# Patient Record
Sex: Female | Born: 1970 | Race: White | Hispanic: No | Marital: Married
Health system: Midwestern US, Community
[De-identification: ages and names within clinical notes are randomized; demographics above are authoritative.]

## PROBLEM LIST (undated history)

## (undated) DIAGNOSIS — N200 Calculus of kidney: Secondary | ICD-10-CM

## (undated) DIAGNOSIS — Z1231 Encounter for screening mammogram for malignant neoplasm of breast: Secondary | ICD-10-CM

## (undated) DIAGNOSIS — N63 Unspecified lump in unspecified breast: Secondary | ICD-10-CM

## (undated) DIAGNOSIS — D473 Essential (hemorrhagic) thrombocythemia: Secondary | ICD-10-CM

## (undated) DIAGNOSIS — Z139 Encounter for screening, unspecified: Secondary | ICD-10-CM

## (undated) DIAGNOSIS — I10 Essential (primary) hypertension: Secondary | ICD-10-CM

## (undated) DIAGNOSIS — E669 Obesity, unspecified: Secondary | ICD-10-CM

## (undated) DIAGNOSIS — M1990 Unspecified osteoarthritis, unspecified site: Secondary | ICD-10-CM

## (undated) DIAGNOSIS — J45909 Unspecified asthma, uncomplicated: Secondary | ICD-10-CM

## (undated) DIAGNOSIS — K648 Other hemorrhoids: Secondary | ICD-10-CM

## (undated) HISTORY — PX: CHOLECYSTECTOMY: SHX55

---

## 2011-06-04 ENCOUNTER — Other Ambulatory Visit: Payer: Self-pay | Admitting: Internal Medicine

## 2011-06-04 DIAGNOSIS — M545 Low back pain, unspecified: Secondary | ICD-10-CM

## 2011-06-08 ENCOUNTER — Other Ambulatory Visit: Payer: Self-pay

## 2011-06-22 ENCOUNTER — Other Ambulatory Visit: Payer: Self-pay

## 2011-06-26 ENCOUNTER — Other Ambulatory Visit: Payer: Self-pay

## 2011-07-01 ENCOUNTER — Ambulatory Visit
Admission: RE | Admit: 2011-07-01 | Discharge: 2011-07-01 | Disposition: A | Discharge status: Home / Self Care | Payer: Medicaid Other | Source: Ambulatory Visit | Attending: Internal Medicine | Admitting: Internal Medicine

## 2011-07-01 DIAGNOSIS — M545 Low back pain, unspecified: Secondary | ICD-10-CM

## 2012-01-07 NOTE — H&P (Signed)
Name:       Melissa Villegas, Melissa Villegas                  Admitted:    01/08/2012    Account #:  [id number]                     DOB:         05-Jun-1970  Physician:  Delfina Redwood, MD          Age:         41                               HISTORY AND PHYSICAL      CHIEF COMPLAINT:  Mixed hemorrhoids which are symptomatic.    HISTORY OF PRESENT ILLNESS: The patient is a 41 year old woman referred by  Willow Ora with a significant history of hemorrhoids. She has had them  for over 15 years. She has no other GI symptomatology. She has frequent  itching and prolapse of tissue at the anal outlet.    PAST MEDICAL HISTORY: Significant for femoral rod in 1992, removal of the  rod from the fracture a year later. Medical history is otherwise negative.      MEDICATIONS:  Paxil 30 mg a day.    ALLERGIES  1.  IBUPROFEN.  2.  NAPROXEN.  3.  NONSTEROIDAL ANTI-INFLAMMATORIES IN GENERAL.    SOCIAL HISTORY: She is married, rarely drinks, does not smoke.    FAMILY HISTORY: Significant for depression and subsequent suicide.    REVIEW OF SYSTEMS  Significant for occasional headaches, heartburn, anal outlet discomfort  from her hemorrhoids, occasional joint pains and the remainder of her 10  system review is negative.    PHYSICAL EXAMINATION  GENERAL: Reveals a pleasant 41 year old woman in no acute distress.  HEENT: ENT is unremarkable. Sclerae clear. Pupils reactive.  NECK: Supple.  LUNGS: Clear.  CARDIAC: Regular, without murmur or failure.  ABDOMEN: Soft and benign. No mass or organomegaly.  BREASTS: Deferred. Groins negative.  RECTAL: Reveals mixed hemorrhoids in all quadrants.  EXTREMITIES: No gross deformity.  NEUROLOGIC: Intact.  SKIN: Clear.    ASSESSMENT: A 41 year old woman with symptomatic mixed hemorrhoids.    PLAN: She requires surgical hemorrhoidectomy to get rid of these.  She is aware of the risks of surgery including anesthesia, infection, and  bleeding, and is agreeable to proceed.                      Delfina Redwood, MD    cc:                       Delfina Redwood, MD      WRT/wmx; D: 01/07/2012 10:27 P; T: 01/07/2012 10:43 P; DOC# [id number]; Job#  [id number]

## 2012-01-08 ENCOUNTER — Inpatient Hospital Stay: Payer: BLUE CROSS/BLUE SHIELD

## 2012-01-08 LAB — HCG URINE, QL. - POC: Pregnancy test,urine (POC): NEGATIVE

## 2012-01-08 MED ADMIN — oxyCODONE-acetaminophen (PERCOCET) 5-325 mg per tablet: ORAL | @ 16:00:00 | NDC 00406051223

## 2012-01-08 MED ADMIN — lactated ringers infusion: INTRAVENOUS | @ 13:00:00 | NDC 00409795309

## 2012-01-08 MED ADMIN — bupivacaine 0.25% -EPINEPHrine 1:200,000 (SENSORCAINE) 0.25 %-1:200,000 injection 50 mg: SUBCUTANEOUS | @ 16:00:00 | NDC 00409904201

## 2012-01-08 MED FILL — OXYCODONE-ACETAMINOPHEN 5 MG-325 MG TAB: 5-325 mg | ORAL | Qty: 1

## 2012-01-08 MED FILL — FENTANYL CITRATE (PF) 50 MCG/ML IJ SOLN: 50 mcg/mL | INTRAMUSCULAR | Qty: 5

## 2012-01-08 MED FILL — LACTATED RINGERS IV: INTRAVENOUS | Qty: 1000

## 2012-01-08 MED FILL — ONDANSETRON (PF) 4 MG/2 ML INJECTION: 4 mg/2 mL | INTRAMUSCULAR | Qty: 2

## 2012-01-08 MED FILL — HYDROMORPHONE (PF) 1 MG/ML IJ SOLN: 1 mg/mL | INTRAMUSCULAR | Qty: 1

## 2012-01-08 MED FILL — MIDAZOLAM 1 MG/ML IJ SOLN: 1 mg/mL | INTRAMUSCULAR | Qty: 2

## 2012-01-08 MED FILL — BUPIVACAINE-EPINEPHRINE (PF) 0.25 %-1:200,000 IJ SOLN: 0.25 %-1:200,000 | INTRAMUSCULAR | Qty: 20

## 2012-01-08 NOTE — Op Note (Signed)
Name:      ABREE, CHIEFFO                                          Surgeon:        Delfina Redwood,   MD  Account #: [id number]                 Surgery Date:   01/08/2012  DOB:       Aug 25, 1970  Age:       41                           Location:                                 OPERATIVE REPORT      PREOPERATIVE DIAGNOSIS: Mixed hemorrhoids in 4 quadrants.    POSTOPERATIVE DIAGNOSIS: Mixed hemorrhoids in 4 quadrants.    PROCEDURES PERFORMED:  Complex hemorrhoidectomy with 4 columns excised.    SURGEON: Tawni Pummel. Julious Payer, MD.    ANESTHESIA: General.    PREOPERATIVE MEDICATIONS: None.    ESTIMATED BLOOD LOSS: 10 mL.    SPECIMENS REMOVED: Mixed hemorrhoidal tissues.    INDICATIONS: The patient is a 41 year old white female who presents with  symptomatic bleeding mixed hemorrhoids. Unfortunately, they are so large  that anything short of hemorrhoidectomy will not alleviate the problems of  prolapse and bleeding. She is therefore in today for surgical  hemorrhoidectomy. She is aware of the risks of anesthesia, infection, and  bleeding and is willing to proceed.    DESCRIPTION OF PROCEDURE: After uneventful induction of general anesthetic,  the patient was placed in prone jackknife position and sterilely prepped  and draped. The right anterior, right posterior, left posterior and left  lateral sets of hemorrhoidal columns were all handled in the same fashion  with the same technique for each column in a sequential fashion. Each  column was infiltrated with 0.25% Marcaine with 1:200,000 epinephrine and  prolapsed out of the canal with hemostats. An apex stitch of 3-0 chromic  was placed; and the column was then sharply excised off the internal  sphincter, leaving the internal sphincter musculature intact as well as the  external sphincter musculature. Bleeding was cauterized with Bovie as it  was encountered. The mucosa was then reapproximated with the previously  placed apex stitch of 3-0 chromic in running locked  fashion down to the  anal verge, where it was converted to a simple running suture. Following  this procedure for all 4 columns, all of her hemorrhoidal tissue was  excised and the medium Ferguson retractor still placed very comfortably in  the anal canal without any stenosis. The perianal area was infiltrated with  0.25% Marcaine with 1:200,000 epinephrine for postoperative pain control.  Sterile dressings were applied and the procedure terminated. She tolerated  the operation well. Blood loss was less than 10 mL. There was no  replacement. She remained stable and left the operating room in  satisfactory condition to return to the recovery and observation area.        Reviewed on 01/08/2012 4:01 PM          Delfina Redwood, MD    cc:   Delfina Redwood, MD  WRT/wmx; D: 01/08/2012 11:49 A; T: 01/08/2012 03:25 P; Doc# [id number]; Job#  [id number]

## 2012-01-08 NOTE — Op Note (Signed)
Name:      Melissa Villegas, Melissa Villegas                                          Surgeon:        Delfina Redwood,   MD  Account #: [id number]                 Surgery Date:   01/08/2012  DOB:       02-22-1971  Age:       41                           Location:                                 OPERATIVE REPORT      PREOPERATIVE DIAGNOSIS: Mixed hemorrhoids in 4 quadrants.    POSTOPERATIVE DIAGNOSIS: Mixed hemorrhoids in 4 quadrants.    PROCEDURES PERFORMED:  Complex hemorrhoidectomy with 4 columns excised.    SURGEON: Tawni Pummel. Julious Payer, MD.    ANESTHESIA: General.    PREOPERATIVE MEDICATIONS: None.    ESTIMATED BLOOD LOSS: 10 mL.    SPECIMENS REMOVED: Mixed hemorrhoidal tissues.    INDICATIONS: The patient is a 41 year old white female who presents with  symptomatic bleeding mixed hemorrhoids. Unfortunately, they are so large  that anything short of hemorrhoidectomy will not alleviate the problems of  prolapse and bleeding. She is therefore in today for surgical  hemorrhoidectomy. She is aware of the risks of anesthesia, infection, and  bleeding and is willing to proceed.    DESCRIPTION OF PROCEDURE: After uneventful induction of general anesthetic,  the patient was placed in prone jackknife position and sterilely prepped  and draped. The right anterior, right posterior, left posterior and left  lateral sets of hemorrhoidal columns were all handled in the same fashion  with the same technique for each column in a sequential fashion. Each  column was infiltrated with 0.25% Marcaine with 1:200,000 epinephrine and  prolapsed out of the canal with hemostats. An apex stitch of 3-0 chromic  was placed; and the column was then sharply excised off the internal  sphincter, leaving the internal sphincter musculature intact as well as the  external sphincter musculature. Bleeding was cauterized with Bovie as it  was encountered. The mucosa was then reapproximated with the previously  placed apex stitch of 3-0 chromic in running locked  fashion down to the  anal verge, where it was converted to a simple running suture. Following  this procedure for all 4 columns, all of her hemorrhoidal tissue was  excised and the medium Ferguson retractor still placed very comfortably in  the anal canal without any stenosis. The perianal area was infiltrated with  0.25% Marcaine with 1:200,000 epinephrine for postoperative pain control.  Sterile dressings were applied and the procedure terminated. She tolerated  the operation well. Blood loss was less than 10 mL. There was no  replacement. She remained stable and left the operating room in  satisfactory condition to return to the recovery and observation area.        Reviewed on 01/08/2012 3:44 PM          Delfina Redwood, MD    cc:   Delfina Redwood, MD  WRT/wmx; D: 01/08/2012 11:49 A; T: 01/08/2012 03:25 P; Doc# [id number]; Job#  [id number]

## 2012-01-08 NOTE — Op Note (Signed)
Full note dictated

## 2012-01-08 NOTE — Other (Signed)
Handoff Report from Operating Room to PACU    Report received from roupe rn and vega crna regarding Melissa Villegas.      Surgeon(s):  Delfina Redwood, MD  And Procedure(s) (LRB):  HEMORRHOIDECTOMY EXCISION (N/A)  confirmed   with allergies and dressings discussed.    Anesthesia type, drugs, patient history, complications, estimated blood loss, vital signs, intake and output, and last pain medication were reviewed.

## 2012-01-08 NOTE — Brief Op Note (Signed)
BRIEF OPERATIVE NOTE    Date of Procedure: 01/08/2012   Preoperative Diagnosis: MIXED HEMORRHOIDS  Postoperative Diagnosis: MIXED HEMORRHOIDS    Procedure: Procedure(s):  COMPLEX HEMORRHOIDECTOMY  Surgeon(s) and Role:     * Delfina Redwood, MD - Primary  Anesthesia: General   Estimated Blood Loss: 10cc's  Specimens:   ID Type Source Tests Collected by Time Destination   1 : Hemorrhoids Preservative Rectal  Delfina Redwood, MD 01/08/2012 1110 Pathology      Findings: mixed hemorrhoids in 4 quadrants   Complications: none  Implants: * No implants in log *

## 2012-01-08 NOTE — Progress Notes (Signed)
+  Post-Anesthesia Evaluation and Assessment    Patient: Melissa Villegas MRN: [id number]  SSN: [id number]   Date of Birth: [birth year]  Age: 41 y.o.  Sex: female      Cardiovascular Function/Vital Signs  BP 126/81   Pulse 102   Temp 98.4 ??F (36.9 ??C)   Resp 14   Ht 5\' 5"  (1.651 m)   Wt 77.111 kg (170 lb)   BMI 28.29 kg/m2   SpO2 100%    Patient is status post Procedure(s) with comments:  HEMORRHOIDECTOMY EXCISION - COMPLEX HEMORRHOIDECTOMY.    Nausea/Vomiting: Controlled.    Postoperative hydration reviewed and adequate.    Pain:  Pain Scale 1: Numeric (0 - 10) (01/08/12 1217)  Pain Intensity 1: 2 (01/08/12 1217)   Managed.    Neurological Status:   Neuro (WDL): Exceptions to WDL (01/08/12 1152)   At baseline.    Mental Status and Level of Consciousness: Arousable.    Pulmonary Status:       Adequate oxygenation and airway patent.    Complications related to anesthesia: None    Post-anesthesia assessment completed. No concerns.    Signed By: Docia Chuck, MD    January 08, 2012

## 2012-01-08 NOTE — Op Note (Signed)
Name:      Melissa Villegas, Melissa Villegas                                          Surgeon:        Oaklan Persons R Alanna Storti,   MD  Account #: [id number]                 Surgery Date:   01/08/2012  DOB:       10/15/1970  Age:       41                           Location:                                 OPERATIVE REPORT      PREOPERATIVE DIAGNOSIS: Mixed hemorrhoids in 4 quadrants.    POSTOPERATIVE DIAGNOSIS: Mixed hemorrhoids in 4 quadrants.    PROCEDURES PERFORMED:  Complex hemorrhoidectomy with 4 columns excised.    SURGEON: Ronit Cranfield R. Lexani Corona, MD.    ANESTHESIA: General.    PREOPERATIVE MEDICATIONS: None.    ESTIMATED BLOOD LOSS: 10 mL.    SPECIMENS REMOVED: Mixed hemorrhoidal tissues.    INDICATIONS: The patient is a 41-year-old white female who presents with  symptomatic bleeding mixed hemorrhoids. Unfortunately, they are so large  that anything short of hemorrhoidectomy will not alleviate the problems of  prolapse and bleeding. She is therefore in today for surgical  hemorrhoidectomy. She is aware of the risks of anesthesia, infection, and  bleeding and is willing to proceed.    DESCRIPTION OF PROCEDURE: After uneventful induction of general anesthetic,  the patient was placed in prone jackknife position and sterilely prepped  and draped. The right anterior, right posterior, left posterior and left  lateral sets of hemorrhoidal columns were all handled in the same fashion  with the same technique for each column in a sequential fashion. Each  column was infiltrated with 0.25% Marcaine with 1:200,000 epinephrine and  prolapsed out of the canal with hemostats. An apex stitch of 3-0 chromic  was placed; and the column was then sharply excised off the internal  sphincter, leaving the internal sphincter musculature intact as well as the  external sphincter musculature. Bleeding was cauterized with Bovie as it  was encountered. The mucosa was then reapproximated with the previously  placed apex stitch of 3-0 chromic in running locked  fashion down to the  anal verge, where it was converted to a simple running suture. Following  this procedure for all 4 columns, all of her hemorrhoidal tissue was  excised and the medium Ferguson retractor still placed very comfortably in  the anal canal without any stenosis. The perianal area was infiltrated with  0.25% Marcaine with 1:200,000 epinephrine for postoperative pain control.  Sterile dressings were applied and the procedure terminated. She tolerated  the operation well. Blood loss was less than 10 mL. There was no  replacement. She remained stable and left the operating room in  satisfactory condition to return to the recovery and observation area.        Reviewed on 01/08/2012 3:44 PM          Mahi Zabriskie R Kera Deacon, MD    cc:   Shakyra Mattera R Caine Barfield, MD          WRT/wmx; D: 01/08/2012 11:49 A; T: 01/08/2012 03:25 P; Doc# [id number]; Job#  [id number]

## 2012-01-08 NOTE — Op Note (Signed)
Name:      Saiki, Kit M                                          Surgeon:        May Ozment R Abhishek Levesque,   MD  Account #: [id number]                 Surgery Date:   01/08/2012  DOB:       09/28/1970  Age:       41                           Location:                                 OPERATIVE REPORT      PREOPERATIVE DIAGNOSIS: Mixed hemorrhoids in 4 quadrants.    POSTOPERATIVE DIAGNOSIS: Mixed hemorrhoids in 4 quadrants.    PROCEDURES PERFORMED:  Complex hemorrhoidectomy with 4 columns excised.    SURGEON: Poseidon Pam R. Monque Haggar, MD.    ANESTHESIA: General.    PREOPERATIVE MEDICATIONS: None.    ESTIMATED BLOOD LOSS: 10 mL.    SPECIMENS REMOVED: Mixed hemorrhoidal tissues.    INDICATIONS: The patient is a 41-year-old white female who presents with  symptomatic bleeding mixed hemorrhoids. Unfortunately, they are so large  that anything short of hemorrhoidectomy will not alleviate the problems of  prolapse and bleeding. She is therefore in today for surgical  hemorrhoidectomy. She is aware of the risks of anesthesia, infection, and  bleeding and is willing to proceed.    DESCRIPTION OF PROCEDURE: After uneventful induction of general anesthetic,  the patient was placed in prone jackknife position and sterilely prepped  and draped. The right anterior, right posterior, left posterior and left  lateral sets of hemorrhoidal columns were all handled in the same fashion  with the same technique for each column in a sequential fashion. Each  column was infiltrated with 0.25% Marcaine with 1:200,000 epinephrine and  prolapsed out of the canal with hemostats. An apex stitch of 3-0 chromic  was placed; and the column was then sharply excised off the internal  sphincter, leaving the internal sphincter musculature intact as well as the  external sphincter musculature. Bleeding was cauterized with Bovie as it  was encountered. The mucosa was then reapproximated with the previously  placed apex stitch of 3-0 chromic in running locked  fashion down to the  anal verge, where it was converted to a simple running suture. Following  this procedure for all 4 columns, all of her hemorrhoidal tissue was  excised and the medium Ferguson retractor still placed very comfortably in  the anal canal without any stenosis. The perianal area was infiltrated with  0.25% Marcaine with 1:200,000 epinephrine for postoperative pain control.  Sterile dressings were applied and the procedure terminated. She tolerated  the operation well. Blood loss was less than 10 mL. There was no  replacement. She remained stable and left the operating room in  satisfactory condition to return to the recovery and observation area.        Reviewed on 01/08/2012 4:01 PM          Shemia Bevel R Diyana Starrett, MD    cc:   Esperansa Sarabia R Lilyauna Miedema, MD          WRT/wmx; D: 01/08/2012 11:49 A; T: 01/08/2012 03:25 P; Doc# [id number]; Job#  [id number]

## 2012-01-08 NOTE — Other (Signed)
Pt and husband informed that one percocet was given at 1225.

## 2012-01-08 NOTE — Other (Signed)
Dr. Julious Payer at bedside to speak with patient and husband.

## 2012-01-08 NOTE — Other (Signed)
Dr. Selena Batten informed of migraine pain, at [facility] to assess.

## 2012-01-09 MED FILL — DEXAMETHASONE SODIUM PHOSPHATE 10 MG/ML IJ SOLN: 10 mg/mL | INTRAMUSCULAR | Qty: 1

## 2012-01-09 MED FILL — LACTATED RINGERS IV: INTRAVENOUS | Qty: 1000

## 2012-01-09 MED FILL — LIDOCAINE (PF) 20 MG/ML (2 %) IJ SOLN: 20 mg/mL (2 %) | INTRAMUSCULAR | Qty: 5

## 2012-01-09 MED FILL — ONDANSETRON (PF) 4 MG/2 ML INJECTION: 4 mg/2 mL | INTRAMUSCULAR | Qty: 2

## 2012-01-09 MED FILL — DIPRIVAN 10 MG/ML INTRAVENOUS EMULSION: 10 mg/mL | INTRAVENOUS | Qty: 20

## 2012-01-09 MED FILL — ROCURONIUM 10 MG/ML IV: 10 mg/mL | INTRAVENOUS | Qty: 5

## 2012-01-09 MED FILL — QUELICIN 20 MG/ML INJECTION SOLUTION: 20 mg/mL | INTRAMUSCULAR | Qty: 10

## 2012-04-27 ENCOUNTER — Encounter

## 2013-07-25 ENCOUNTER — Emergency Department (HOSPITAL_COMMUNITY)
Admission: EM | Admit: 2013-07-25 | Discharge: 2013-07-25 | Disposition: A | Discharge status: Home / Self Care | Payer: Medicaid Other | Attending: Emergency Medicine | Admitting: Emergency Medicine

## 2013-07-25 ENCOUNTER — Emergency Department (HOSPITAL_COMMUNITY): Payer: Medicaid Other

## 2013-07-25 ENCOUNTER — Encounter (HOSPITAL_COMMUNITY): Payer: Self-pay | Admitting: Emergency Medicine

## 2013-07-25 DIAGNOSIS — Z79899 Other long term (current) drug therapy: Secondary | ICD-10-CM | POA: Insufficient documentation

## 2013-07-25 DIAGNOSIS — I10 Essential (primary) hypertension: Secondary | ICD-10-CM | POA: Insufficient documentation

## 2013-07-25 DIAGNOSIS — Z8739 Personal history of other diseases of the musculoskeletal system and connective tissue: Secondary | ICD-10-CM | POA: Insufficient documentation

## 2013-07-25 DIAGNOSIS — E669 Obesity, unspecified: Secondary | ICD-10-CM | POA: Insufficient documentation

## 2013-07-25 DIAGNOSIS — R000 Tachycardia, unspecified: Secondary | ICD-10-CM | POA: Insufficient documentation

## 2013-07-25 DIAGNOSIS — IMO0002 Reserved for concepts with insufficient information to code with codable children: Secondary | ICD-10-CM | POA: Insufficient documentation

## 2013-07-25 DIAGNOSIS — J45901 Unspecified asthma with (acute) exacerbation: Secondary | ICD-10-CM | POA: Insufficient documentation

## 2013-07-25 DIAGNOSIS — Z880 Allergy status to penicillin: Secondary | ICD-10-CM | POA: Insufficient documentation

## 2013-07-25 HISTORY — DX: Unspecified asthma, uncomplicated: J45.909

## 2013-07-25 HISTORY — DX: Unspecified osteoarthritis, unspecified site: M19.90

## 2013-07-25 HISTORY — DX: Obesity, unspecified: E66.9

## 2013-07-25 HISTORY — DX: Essential (primary) hypertension: I10

## 2013-07-25 LAB — CBC
HCT: 38.1 % (ref 36.0–46.0)
Hemoglobin: 12 g/dL (ref 12.0–15.0)
MCH: 29.3 pg (ref 26.0–34.0)
MCHC: 31.5 g/dL (ref 30.0–36.0)
MCV: 93.2 fL (ref 78.0–100.0)
Platelets: 287 10*3/uL (ref 150–400)
RBC: 4.09 MIL/uL (ref 3.87–5.11)
RDW: 13 % (ref 11.5–15.5)
WBC: 8.9 10*3/uL (ref 4.0–10.5)

## 2013-07-25 LAB — I-STAT TROPONIN, ED: Troponin i, poc: 0 ng/mL (ref 0.00–0.08)

## 2013-07-25 LAB — BASIC METABOLIC PANEL
BUN: 8 mg/dL (ref 6–23)
CO2: 22 mEq/L (ref 19–32)
Calcium: 8.6 mg/dL (ref 8.4–10.5)
Chloride: 102 mEq/L (ref 96–112)
Creatinine, Ser: 0.69 mg/dL (ref 0.50–1.10)
GFR calc Af Amer: >90 mL/min (ref >90)
GFR calc non Af Amer: >90 mL/min (ref >90)
Glucose, Bld: 114 mg/dL — ABNORMAL HIGH (ref 70–99)
Potassium: 3.8 mEq/L (ref 3.7–5.3)
Sodium: 138 mEq/L (ref 137–147)

## 2013-07-25 LAB — PRO B NATRIURETIC PEPTIDE: Pro B Natriuretic peptide (BNP): 19 pg/mL (ref 0–125)

## 2013-07-25 MED ORDER — IPRATROPIUM BROMIDE 0.02 % IN SOLN
RESPIRATORY_TRACT | Status: AC
  Filled 2013-07-25: qty 2.5

## 2013-07-25 MED ORDER — ALBUTEROL (5 MG/ML) CONTINUOUS INHALATION SOLN
15.0000 mg | INHALATION_SOLUTION | Freq: Once | RESPIRATORY_TRACT | Status: AC
  Administered 2013-07-25: 15 mg via RESPIRATORY_TRACT

## 2013-07-25 MED ORDER — IPRATROPIUM BROMIDE 0.02 % IN SOLN
0.5000 mg | Freq: Once | RESPIRATORY_TRACT | Status: AC
  Administered 2013-07-25: 0.5 mg via RESPIRATORY_TRACT

## 2013-07-25 MED ORDER — ALBUTEROL SULFATE (2.5 MG/3ML) 0.083% IN NEBU
5.0000 mg | INHALATION_SOLUTION | Freq: Once | RESPIRATORY_TRACT | Status: AC
  Administered 2013-07-25: 5 mg via RESPIRATORY_TRACT
  Filled 2013-07-25: qty 6

## 2013-07-25 MED ORDER — PREDNISONE 20 MG PO TABS: 60.0000 mg | ORAL_TABLET | Freq: Every day | ORAL | Status: DC

## 2013-07-25 MED ORDER — METHYLPREDNISOLONE SODIUM SUCC 125 MG IJ SOLR
80.0000 mg | Freq: Once | INTRAMUSCULAR | Status: AC
  Administered 2013-07-25: 80 mg via INTRAVENOUS
  Filled 2013-07-25: qty 2

## 2013-07-25 MED ORDER — ALBUTEROL (5 MG/ML) CONTINUOUS INHALATION SOLN
10.0000 mg/h | INHALATION_SOLUTION | RESPIRATORY_TRACT | Status: DC
  Filled 2013-07-25: qty 20

## 2013-07-25 NOTE — ED Provider Notes (Signed)
CSN: [id number]     Arrival date & time 07/25/13  0620 History   First MD Initiated Contact with Patient 07/25/13 [phone]     Chief Complaint  Patient presents with  . Wheezing  . Cough     (Consider location/radiation/quality/duration/timing/severity/associated sxs/prior Treatment) HPI   This a 43 year old female with history of asthma who presents with cough and wheezing. Patient reports onset of symptoms yesterday. She states that she is visiting and did not have her nebulizer machine. She has been using her inhaler without relief.  She states "everything triggers her asthma." She has required hospitalization in the past. She reports last hospitalization 6 years ago. She denies any fever but does endorse a nonproductive cough. She endorses shortness of breath and chest tightness that is worse with breathing.  Past Medical History  Diagnosis Date  . Asthma   . Arthritis   . Obesity   . Hypertension    Past Surgical History  Procedure Laterality Date  . Cholecystectomy     No family history on file. History  Substance Use Topics  . Smoking status: Never Smoker   . Smokeless tobacco: Not on file  . Alcohol Use: No   OB History   Grav Para Term Preterm Abortions TAB SAB Ect Mult Living                 Review of Systems  Constitutional: Negative for fever.  Respiratory: Positive for cough, chest tightness, shortness of breath and wheezing.   Cardiovascular: Negative for chest pain and leg swelling.  Gastrointestinal: Negative for nausea, vomiting and abdominal pain.  Genitourinary: Negative for dysuria.  Musculoskeletal: Negative for back pain.  Skin: Negative for wound.  Neurological: Negative for headaches.  Psychiatric/Behavioral: Negative for confusion.  All other systems reviewed and are negative.      Allergies  Prednisone and Penicillins  Home Medications   Current Outpatient Rx  Name  Route  Sig  Dispense  Refill  . albuterol (PROVENTIL HFA;VENTOLIN  HFA) 108 (90 BASE) MCG/ACT inhaler   Inhalation   Inhale into the lungs every 6 (six) hours as needed for wheezing or shortness of breath.         Marland Kitchen albuterol (PROVENTIL) (2.5 MG/3ML) 0.083% nebulizer solution   Nebulization   Take 2.5 mg by nebulization 2 (two) times daily.         . fluticasone (FLOVENT DISKUS) 50 MCG/BLIST diskus inhaler   Inhalation   Inhale 1 puff into the lungs 2 (two) times daily.         . predniSONE (DELTASONE) 20 MG tablet   Oral   Take 3 tablets (60 mg total) by mouth daily.   15 tablet   0   . predniSONE (DELTASONE) 20 MG tablet   Oral   Take 3 tablets (60 mg total) by mouth daily.   15 tablet   0    BP 132/48  Pulse 155  Temp(Src) 98.4 F (36.9 C) (Oral)  Resp 26  Ht 5' 2"  (1.575 m)  Wt 360 lb (163.295 kg)  BMI 65.83 kg/m2  SpO2 99%  LMP 07/16/2013 Physical Exam  Nursing note and vitals reviewed. Constitutional: She is oriented to person, place, and time.  Morbidly obese, tachypneic  HENT:  Head: Normocephalic and atraumatic.  Mouth/Throat: Oropharynx is clear and moist.  Cardiovascular: Regular rhythm and normal heart sounds.   tachycardia  Pulmonary/Chest: She is in respiratory distress. She has wheezes.  Increased work of breathing, diffuse expiratory wheezing, fair  air movement  Abdominal: Soft. There is no tenderness.  Musculoskeletal: She exhibits no edema.  Neurological: She is alert and oriented to person, place, and time.  Skin: Skin is warm and dry.  Psychiatric: She has a normal mood and affect.    ED Course  Procedures (including critical care time) Labs Review Labs Reviewed  BASIC METABOLIC PANEL - Abnormal; Notable for the following:    Glucose, Bld 114 (*)    All other components within normal limits  CBC  PRO B NATRIURETIC PEPTIDE  I-STAT TROPOININ, ED   Imaging Review Dg Chest Port 1 View  07/25/2013   CLINICAL DATA:  Shortness of breath  EXAM: PORTABLE CHEST - 1 VIEW  COMPARISON:  09/06/2011   FINDINGS: The heart size and mediastinal contours are within normal limits. Both lungs are clear. The visualized skeletal structures are unremarkable.  IMPRESSION: No active disease.   Electronically Signed   By: Inez Catalina M.D.   On: 07/25/2013 07:55     EKG Interpretation   Date/Time:  Sunday July 25 2013 06:51:57 EDT Ventricular Rate:  96 PR Interval:  141 QRS Duration: 76 QT Interval:  350 QTC Calculation: 442 R Axis:   51 Text Interpretation:  Sinus rhythm Consider left atrial enlargement  Abnormal R-wave progression, early transition Confirmed by Dalores Weger  MD,  [place] ([id number]) on 07/25/2013 7:00:18 AM      MDM   Final diagnoses:  Asthma exacerbation    Patient presents with shortness of breath and wheezing. She is tachypneic on exam. She has diffuse wheezing. Placed on continuous DuoNeb for one hour. Patient was given Solu-Medrol. Chest x-ray shows no evidence of pneumonia. EKG is reassuring.  Lab work is unremarkable  9:23 AM Patient is requesting discharge. She reports improvement of symptoms. Continues to have some wheezing but improved from initial exam. Patient will be given prednisone for 5 days. She reports intolerance secondary to prolonged menstrual periods but has agreed to take for the next 5 days. She ambulated with pulse ox and maintained her oxygen saturations. She did become tachycardic but is status post one hour of albuterol. Patient was given strict return precautions.  After history, exam, and medical workup I feel the patient has been appropriately medically screened and is safe for discharge home. Pertinent diagnoses were discussed with the patient. Patient was given return precautions.    Merryl Hacker, MD 07/25/13 [phone]

## 2013-07-25 NOTE — ED Notes (Signed)
Pt. From [place]. Staying with sister. Woke up asthma exasperation.

## 2013-07-25 NOTE — Discharge Instructions (Signed)
Asthma, Acute Bronchospasm Acute bronchospasm caused by asthma is also referred to as an asthma attack. Bronchospasm means your air passages become narrowed. The narrowing is caused by inflammation and tightening of the muscles in the air tubes (bronchi) in your lungs. This can make it hard to breath or cause you to wheeze and cough. CAUSES Possible triggers are:  Animal dander from the skin, hair, or feathers of animals.  Dust mites contained in house dust.  Cockroaches.  Pollen from trees or grass.  Mold.  Cigarette or tobacco smoke.  Air pollutants such as dust, household cleaners, hair sprays, aerosol sprays, paint fumes, strong chemicals, or strong odors.  Cold air or weather changes. Cold air may trigger inflammation. Winds increase molds and pollens in the air.  Strong emotions such as crying or laughing hard.  Stress.  Certain medicines such as aspirin or beta-blockers.  Sulfites in foods and drinks, such as dried fruits and wine.  Infections or inflammatory conditions, such as a flu, cold, or inflammation of the nasal membranes (rhinitis).  Gastroesophageal reflux disease (GERD). GERD is a condition where stomach acid backs up into your throat (esophagus).  Exercise or strenuous activity. SIGNS AND SYMPTOMS   Wheezing.  Excessive coughing, particularly at night.  Chest tightness.  Shortness of breath. DIAGNOSIS  Your health care provider will ask you about your medical history and perform a physical exam. A chest X-ray or blood testing may be performed to look for other causes of your symptoms or other conditions that may have triggered your asthma attack. TREATMENT  Treatment is aimed at reducing inflammation and opening up the airways in your lungs. Most asthma attacks are treated with inhaled medicines. These include quick relief or rescue medicines (such as bronchodilators) and controller medicines (such as inhaled corticosteroids). These medicines are  sometimes given through an inhaler or a nebulizer. Systemic steroid medicine taken by mouth or given through an IV tube also can be used to reduce the inflammation when an attack is moderate or severe. Antibiotic medicines are only used if a bacterial infection is present.  HOME CARE INSTRUCTIONS   Rest.  Drink plenty of liquids. This helps the mucus to remain thin and be easily coughed up. Only use caffeine in moderation and do not use alcohol until you have recovered from your illness.  Do not smoke. Avoid being exposed to secondhand smoke.  You play a critical role in keeping yourself in good health. Avoid exposure to things that cause you to wheeze or to have breathing problems.  Keep your medicines up to date and available. Carefully follow your health care provider's treatment plan.  Take your medicine exactly as prescribed.  When pollen or pollution is bad, keep windows closed and use an air conditioner or go to places with air conditioning.  Asthma requires careful medical care. See your health care provider for a follow-up as advised. If you are more than [redacted] weeks pregnant and you were prescribed any new medicines, let your obstetrician know about the visit and how you are doing. Follow-up with your health care provider as directed.  After you have recovered from your asthma attack, make an appointment with your outpatient doctor to talk about ways to reduce the likelihood of future attacks. If you do not have a doctor who manages your asthma, make an appointment with a primary care doctor to discuss your asthma. SEEK IMMEDIATE MEDICAL CARE IF:   You are getting worse.  You have trouble breathing. If severe, call  your local emergency services (911 in the [place]  You develop chest pain or discomfort.  You are vomiting.  You are not able to keep fluids down.  You are coughing up yellow, green, [place], or bloody sputum.  You have a fever and your symptoms suddenly get  worse.  You have trouble swallowing. MAKE SURE YOU:   Understand these instructions.  Will watch your condition.  Will get help right away if you are not doing well or get worse. Document Released: 07/24/2006 Document Revised: 12/09/2012 Document Reviewed: 10/14/2012 [facility] Patient Information 2014 [place], [place].

## 2013-07-25 NOTE — ED Notes (Signed)
Pt. woke up this morning with wheezing , productive cough , SOB and chest tightness , Pt. stated history of Bronchitis and Asthma .

## 2014-08-02 ENCOUNTER — Encounter

## 2014-08-03 ENCOUNTER — Inpatient Hospital Stay: Admit: 2014-08-03 | Payer: BLUE CROSS/BLUE SHIELD | Attending: Family Medicine | Primary: Family Medicine

## 2014-08-03 ENCOUNTER — Encounter

## 2014-08-03 DIAGNOSIS — N63 Unspecified lump in unspecified breast: Secondary | ICD-10-CM

## 2014-08-04 ENCOUNTER — Ambulatory Visit: Payer: BLUE CROSS/BLUE SHIELD | Primary: Family Medicine

## 2014-08-09 ENCOUNTER — Ambulatory Visit: Payer: BLUE CROSS/BLUE SHIELD | Primary: Family Medicine

## 2014-08-10 ENCOUNTER — Ambulatory Visit: Payer: BLUE CROSS/BLUE SHIELD | Primary: Family Medicine

## 2015-01-03 ENCOUNTER — Ambulatory Visit
Admit: 2015-01-03 | Discharge: 2015-01-03 | Payer: PRIVATE HEALTH INSURANCE | Attending: Neurology | Primary: Family Medicine

## 2015-01-03 DIAGNOSIS — G43C1 Periodic headache syndromes in child or adult, intractable: Secondary | ICD-10-CM

## 2015-01-03 MED ORDER — ELETRIPTAN 40 MG TAB
40 mg | ORAL_TABLET | Freq: Once | ORAL | 0 refills | Status: DC | PRN
Start: 2015-01-03 — End: 2017-02-21

## 2015-01-03 MED ORDER — DICLOFENAC POTASSIUM 50 MG ORAL POWDER PACKET
50 mg | PACK | ORAL | 0 refills | Status: AC
Start: 2015-01-03 — End: ?

## 2015-01-03 NOTE — Progress Notes (Signed)
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    HPI:     In 1992, patient was a riding a horse which went to a tree and feel and hit the L side of her head on a tree. (+) LOC. Patient was brought to [facility] shock Trauma and admitted for 10 days for L femur fracture.     Since then, patient noted headaches, that starts in the L shoulder, goes to neck and L temporal area, throbbing, usually lasts for 4 days, 10/10, occurring 2/ month, aggravated by change in weather, being tired, bumpy roads, Ice helps    Tried Imitrex, Amitriptyline, Nortriptyline, with no benefit      Review of Systems   Constitutional: Negative for chills and fever.   All other systems reviewed and are negative.        Past Medical Hx  Past Medical History   Diagnosis Date   ??? GERD (gastroesophageal reflux disease)    ??? Other ill-defined conditions(799.89) 1992     migraines       Social Hx  Social History     Social History   ??? Marital status: MARRIED     Spouse name: N/A   ??? Number of children: N/A   ??? Years of education: N/A     Social History Main Topics   ??? Smoking status: Never Smoker   ??? Smokeless tobacco: Never Used   ??? Alcohol use Yes      Comment: occasionally   ??? Drug use: None   ??? Sexual activity: Not Asked     Other Topics Concern   ??? None     Social History Narrative       Family Hx  Family History   Problem Relation Age of Onset   ??? Headache Mother    ??? Heart Disease Maternal Grandfather    ??? Headache Paternal Grandmother        ALLERGIES  Allergies   Allergen Reactions   ??? Ibuprofen Swelling   ??? Naproxen Swelling       CURRENT MEDS  Effexor  Trazodone 50  Wellbutrin  Ortho tri cycline  celebrex          PREVIOUS WORKUP: (reviewed)  IMAGING: in 1992 was okay as per patient    CT Results (recent):   No results found for this or any previous visit.    MRI Results (recent):  No results found for this or any previous visit.    IR Results (recent):  No results found for this or any previous visit.    VAS/US Results (recent):  No results found for this or any previous visit.        LABS (reviewed)  Results for orders placed or performed during the hospital encounter of 01/08/12   HCG URINE, QL. - POC   Result Value Ref Range    Pregnancy test,urine (POC) NEGATIVE  NEGATIVE       Physical Exam:     Visit Vitals   ??? BP 110/64   ??? Ht 5' 5"  (1.651 m)   ??? Wt 68.9 kg (152 lb)   ??? BMI 25.29  kg/m2     General:  Alert, cooperative, no distress.   Head:  Normocephalic, without obvious abnormality, atraumatic.   Eyes:  Conjunctivae/corneas clear. Pupils equal, round, reactive to light. Extraocular movements intact, VFF, NO papilledema   Lungs:  Heart:   Non labored breathing  Regular rate and rhythm, no carotid bruits   Abdomen:   Soft, non-distended   Extremities: Extremities normal, atraumatic, no cyanosis or edema.   Pulses: 2+ and symmetric all extremities.   Skin: Skin color, texture, turgor normal. No rashes or lesions.  Neurologic Exam     Gen: Attention normal             Language: naming, repetition, fluency normal             Memory: intact recent and remote memory  Cranial Nerves:  I: smell Not tested   II: visual fields Full to confrontation   II: pupils Equal, round, reactive to light   II: optic disc No papilledema   III,VII: ptosis none   III,IV,VI: extraocular muscles  Full ROM   V: mastication normal   V: facial light touch sensation  normal   VII: facial muscle function   symmetric   VIII: hearing symmetric   IX: soft palate elevation  normal   XI: trapezius strength  5/5   XI: sternocleidomastoid strength 5/5   XI: neck flexion strength  5/5   XII: tongue  midline     Motor: normal bulk and tone, no tremor              Strength: 5/5 all four extremities  Sensory: intact to LT, PP, vibration, and JPS   Reflexes: 2+ throughout; Down going toes  Coordination: Good FTN and HTS, Romberg negative  Gait: normal gait including tandem            Impression:     Melissa Villegas is a 44 y.o. female who  has a past medical history of GERD (gastroesophageal reflux disease) and Other ill-defined conditions(799.89) (1992). who in 1992, was riding a horse which went to a tree, fell and hit the L side of her head on a tree. (+) LOC. Patient was brought to [facility] shock Trauma and admitted for 10 days for L femur fracture.  Since then, patient noted headaches, that starts in the L shoulder, goes to L neck and L temporal area, throbbing, usually lasts for 4 days, 10/10, occurring 2/ month, aggravated by change in weather, being tired, bumpy roads, Ice helps. Patient tried Imitrex, Amitriptyline, Nortriptyline, with no benefit.    Considerations include migraine s/p head trauma, intractable    RECOMMENDATIONS  1. Given samples of Cambia and Relpax. Patient allergic to Naproxen and ibuprofen, but willing to try Cambia  2. Given information about migraine and potential triggers (weather, fatigue)  3. Suggest use of soft neck collar at night to keep neck straight when she is sleeping and prolonged driving (headache is triggered when her neck is bent to the L side)    Follow-up Disposition: Not on File        Thank you for the consultation      Shelbie Hutching, MD  Diplomate, American Board of Psychiatry and Neurology  Diplomate, Neuromuscular Medicine  Diplomate, American Board of Electrodiagnostic Medicine    Greater than 50% of time spent counseling patient      CC: Alysia Penna, DO  Fax: [phone]

## 2015-01-03 NOTE — Patient Instructions (Addendum)
Learning About Living Eugenie Birks  What is a living will?  A living will is a legal form you use to write down the kind of care you want at the end of your life. It is used by the health professionals who will treat you if you aren't able to decide for yourself.  If you put your wishes in writing, your loved ones and others will know what kind of care you want. They won't need to guess. This can ease your mind and be helpful to others.  A living will is not the same as an estate or property will. An estate will explains what you want to happen with your money and property after you die.  Is a living will a legal document?  A living will is a legal document. Each state has its own laws about living wills. If you move to another state, make sure that your living will is legal in the state where you now live. Or you might use a universal form that has been approved by many states. This kind of form can sometimes be completed and stored online. Your electronic copy will then be available wherever you have a connection to the Internet. In most cases, doctors will respect your wishes even if you have a form from a different state.  ?? You don't need an attorney to complete a living will. But legal advice can be helpful if your state's laws are unclear, your health history is complicated, or your family can't agree on what should be in your living will.  ?? You can change your living will at any time. Some people find that their wishes about end-of-life care change as their health changes.  ?? In addition to making a living will, think about completing a medical power of attorney form. This form lets you name the person you want to make end-of-life treatment decisions for you (your "health care agent") if you're not able to. Many hospitals and nursing homes will give you the forms you need to complete a living will and a medical power of attorney.  ?? Your living will is used only if you can't make or communicate decisions  for yourself anymore. If you become able to make decisions again, you can accept or refuse any treatment, no matter what you wrote in your living will.  ?? Your state may offer an online registry. This is a place where you can store your living will online so the doctors and nurses who need to treat you can find it right away.  What should you think about when creating a living will?  Talk about your end-of-life wishes with your family members and your doctor. Let them know what you want. That way the people making decisions for you won't be surprised by your choices.  Think about these questions as you make your living will:  ?? Do you know enough about life support methods that might be used? If not, talk to your doctor so you know what might be done if you can't breathe on your own, your heart stops, or you're unable to swallow.  ?? What things would you still want to be able to do after you receive life-support methods? Would you want to be able to walk? To speak? To eat on your own? To live without the help of machines?  ?? If you have a choice, where do you want to be cared for? In your home? At a hospital or nursing home?  ??  Do you want certain religious practices performed if you become very ill?  ?? If you have a choice at the end of your life, where would you prefer to die? At home? In a hospital or nursing home? Somewhere else?  ?? Would you prefer to be buried or cremated?  ?? Do you want your organs to be donated after you die?  What should you do with your living will?  ?? Make sure that your family members and your health care agent have copies of your living will.  ?? Give your doctor a copy of your living will to keep in your medical record. If you have more than one doctor, make sure that each one has a copy.  ?? You may want to put a copy of your living will where it can be easily found.  Where can you learn more?  Go to StreetWrestling.at   Enter K356 in the search box to learn more about "[place]."  ?? 2006-2016 Healthwise, Incorporated. Care instructions adapted under license by [employer] (which disclaims liability or warranty for this information). This care instruction is for use with your licensed healthcare professional. If you have questions about a medical condition or this instruction, always ask your healthcare professional. [place] any warranty or liability for your use of this information.  Content Version: 10.9.538570; Current as of: June 15, 2014        Advance Directives: Care Instructions  Your Care Instructions  An advance directive is a legal way to state your wishes at the end of your life. It tells your family and your doctor what to do if you can no longer say what you want.  There are two main types of advance directives. You can change them any time that your wishes change.  ?? A living will tells your family and your doctor your wishes about life support and other treatment.  ?? A medical power of attorney lets you name a person to make treatment decisions for you when you can't speak for yourself. This person is called a health care agent.  If you do not have an advance directive, decisions about your medical care may be made by a doctor or a judge who doesn't know you.  It may help to think of an advance directive as a gift to the people who care for you. If you have one, they won't have to make tough decisions by themselves.  Follow-up care is a key part of your treatment and safety. Be sure to make and go to all appointments, and call your doctor if you are having problems. It's also a good idea to know your test results and keep a list of the medicines you take.  How can you care for yourself at home?  ?? Discuss your wishes with your loved ones and your doctor. This way, there are no surprises.   ?? Many states have a unique form. Or you might use a universal form that has been approved by many states. This kind of form can sometimes be completed and stored online. Your electronic copy will then be available wherever you have a connection to the Internet. In most cases, doctors will respect your wishes even if you have a form from a different state.  ?? You don't need a lawyer to do an advance directive. But you may want to get legal advice.  ?? Think about these questions when you prepare an advance directive:  ??  Who do you want to make decisions about your medical care if you are not able to? Many people choose a family member, close friend, or doctor.  ?? Do you know enough about life support methods that might be used? If not, talk to your doctor so you understand.  ?? What are you most afraid of that might happen? You might be afraid of having pain, losing your independence, or being kept alive by machines.  ?? Where would you prefer to die? Choices include your home, a hospital, or a nursing home.  ?? Would you like to have information about hospice care to support you and your family?  ?? Do you want to donate organs when you die?  ?? Do you want certain religious practices performed before you die? If so, put your wishes in the advance directive.  ?? Read your advance directive every year, and make changes as needed.  When should you call for help?  Be sure to contact your doctor if you have any questions.  Where can you learn more?  Go to StreetWrestling.at  Enter R264 in the search box to learn more about "Advance Directives: Care Instructions."  ?? 2006-2016 Healthwise, Inc[employer]. Care instructions adapted under license by [employer] (which disclaims liability or warranty for this information). This care instruction is for use with your licensed healthcare professional. If you have questions about a medical condition  or this instruction, always ask your healthcare professional. [place] any warranty or liability for your use of this information.  Content Version: 10.9.538570; Current as of: June 15, 2014    [facility]   Statement to Patients  July 21, 2012      In an effort to ensure the large volume of patient prescription refills is processed in the most efficient and expeditious manner, we are asking our patients to assist [place] by calling your Pharmacy for all prescription refills, this will include also your  Mail Order Pharmacy. The pharmacy will contact our office electronically to continue the refill process.    Please do not wait until the last minute to call your pharmacy. We need at least 48 hours (2days) to fill prescriptions. We also encourage you to call your pharmacy before going to pick up your prescription to make sure it is ready.     With regard to controlled substance prescription refill requests (narcotic refills) that need to be picked up at our office, we ask your cooperation by providing [place] with at least 72 hours (3days) notice that you will need a refill.    We will not refill narcotic prescription refill requests after 4:00pm on any weekday, Monday through Thursday, or after 2:00pm on Fridays, or on the weekends.      We encourage everyone to explore another way of getting your prescription refill request processed using MyChart, our patient web portal through our electronic medical record system. MyChart is an efficient and effective way to communicate your medication request directly to the office and  downloadable as an app on your smart phone . MyChart also features a review functionality that allows you to view your medication list as well as leave messages for your physician. Are you ready to get connected? If so please review the attatched instructions or speak to any of our staff to get you set up right away!     Thank you so much for your cooperation. Should you have any questions please  contact our Engineer, building services.    The Physicians and Staff,  [facility]

## 2015-01-03 NOTE — Progress Notes (Signed)
Patient statess she has tried ibuprofen aleve naproxen amitriptyline, nortriptyline without success.  Reviewed record in preparation for visit and have necessary documentation  Pt did not bring medication to office visit for review  Information was given to pt on Advanced Directives, Living Will  opportunity was given for questions

## 2015-01-23 MED ORDER — ELETRIPTAN 40 MG TAB
40 mg | ORAL_TABLET | Freq: Once | ORAL | 5 refills | Status: DC | PRN
Start: 2015-01-23 — End: 2016-01-25

## 2015-01-23 NOTE — Telephone Encounter (Signed)
Need a Rx for Relpax the med is working

## 2015-01-23 NOTE — Telephone Encounter (Signed)
Patient called states Relpax is working please review and sign if approved.    Thanks

## 2015-02-07 ENCOUNTER — Encounter: Attending: Neurology | Primary: Family Medicine

## 2015-06-23 ENCOUNTER — Encounter

## 2015-06-30 ENCOUNTER — Encounter

## 2015-06-30 ENCOUNTER — Inpatient Hospital Stay: Admit: 2015-06-30 | Payer: BLUE CROSS/BLUE SHIELD | Attending: Internal Medicine | Primary: Family Medicine

## 2015-06-30 DIAGNOSIS — D473 Essential (hemorrhagic) thrombocythemia: Secondary | ICD-10-CM

## 2015-07-13 IMAGING — CR DG CHEST 1V PORT
1 series · 1 of 1 positions shown · non-contrast
Comparison: 09/06/2011

CLINICAL DATA: Shortness of breath

EXAM:
PORTABLE CHEST - 1 VIEW

[view not recorded]
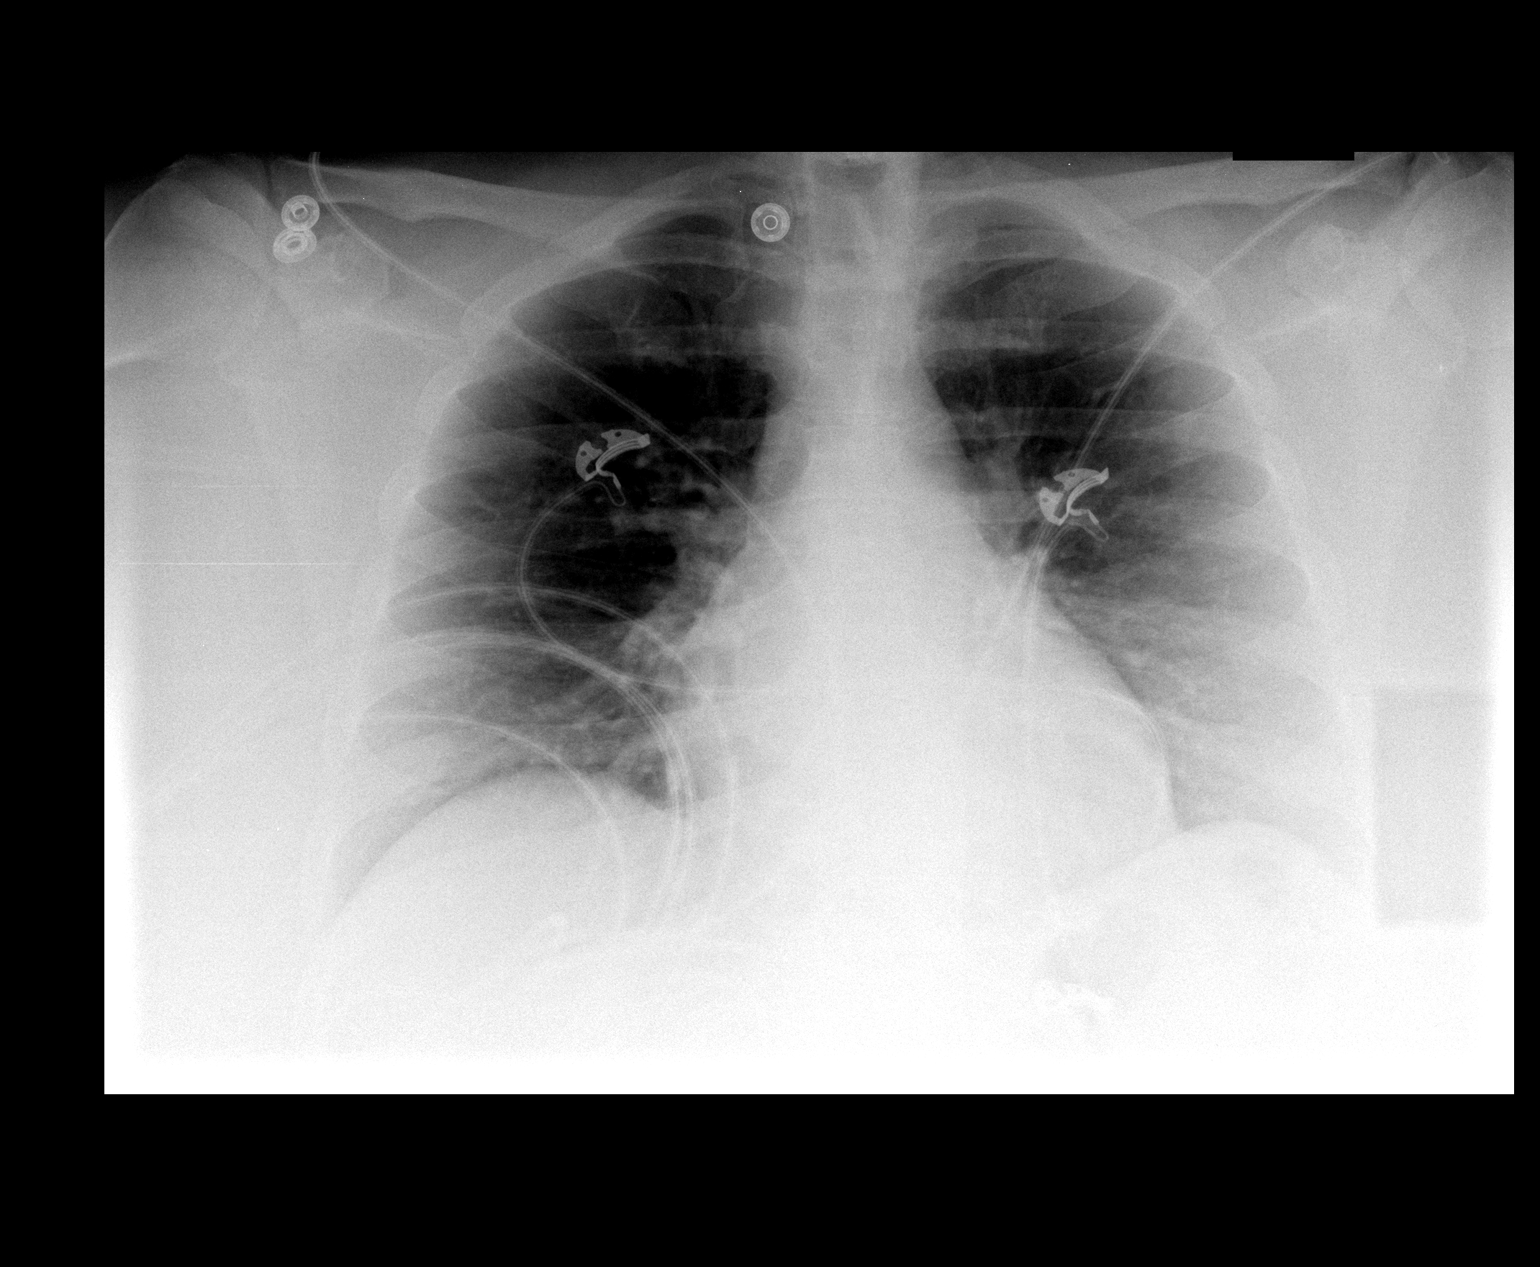

[1 of 1 positions shown; findings below may reference images not displayed]

FINDINGS: The heart size and mediastinal contours are within normal limits.
Both lungs are clear. The visualized skeletal structures are
unremarkable.
IMPRESSION: No active disease.

## 2015-10-13 ENCOUNTER — Inpatient Hospital Stay: Admit: 2015-10-13 | Payer: BLUE CROSS/BLUE SHIELD | Attending: Urology | Primary: Family Medicine

## 2015-10-13 ENCOUNTER — Encounter

## 2015-10-13 DIAGNOSIS — N202 Calculus of kidney with calculus of ureter: Secondary | ICD-10-CM

## 2016-01-25 MED ORDER — ELETRIPTAN 40 MG TAB
40 mg | ORAL_TABLET | Freq: Once | ORAL | 2 refills | Status: DC | PRN
Start: 2016-01-25 — End: 2017-02-21

## 2017-02-21 MED ORDER — ELETRIPTAN 40 MG TAB
40 mg | ORAL_TABLET | ORAL | 0 refills | Status: DC
Start: 2017-02-21 — End: 2017-05-20

## 2017-05-21 MED ORDER — ELETRIPTAN 40 MG TAB
40 mg | ORAL_TABLET | ORAL | 0 refills | Status: AC
Start: 2017-05-21 — End: ?

## 2022-04-03 ENCOUNTER — Emergency Department (HOSPITAL_BASED_OUTPATIENT_CLINIC_OR_DEPARTMENT_OTHER)
Admission: EM | Admit: 2022-04-03 | Discharge: 2022-04-03 | Disposition: A | Discharge status: Home / Self Care | Payer: Medicaid Other | Attending: Emergency Medicine | Admitting: Emergency Medicine

## 2022-04-03 ENCOUNTER — Encounter (HOSPITAL_BASED_OUTPATIENT_CLINIC_OR_DEPARTMENT_OTHER): Payer: Self-pay

## 2022-04-03 DIAGNOSIS — N939 Abnormal uterine and vaginal bleeding, unspecified: Secondary | ICD-10-CM | POA: Insufficient documentation

## 2022-04-03 LAB — CBC WITH DIFFERENTIAL/PLATELET
Abs Immature Granulocytes: 0.01 10*3/uL (ref 0.00–0.07)
Basophils Absolute: 0 10*3/uL (ref 0.0–0.1)
Basophils Relative: 1 %
Eosinophils Absolute: 0 10*3/uL (ref 0.0–0.5)
Eosinophils Relative: 0 %
HCT: 41 % (ref 36.0–46.0)
Hemoglobin: 12.8 g/dL (ref 12.0–15.0)
Immature Granulocytes: 0 %
Lymphocytes Relative: 41 %
Lymphs Abs: 2.2 10*3/uL (ref 0.7–4.0)
MCH: 28 pg (ref 26.0–34.0)
MCHC: 31.2 g/dL (ref 30.0–36.0)
MCV: 89.7 fL (ref 80.0–100.0)
Monocytes Absolute: 0.3 10*3/uL (ref 0.1–1.0)
Monocytes Relative: 5 %
Neutro Abs: 2.8 10*3/uL (ref 1.7–7.7)
Neutrophils Relative %: 53 %
Platelets: 270 10*3/uL (ref 150–400)
RBC: 4.57 MIL/uL (ref 3.87–5.11)
RDW: 13.1 % (ref 11.5–15.5)
WBC: 5.4 10*3/uL (ref 4.0–10.5)
nRBC: 0 % (ref 0.0–0.2)

## 2022-04-03 LAB — COMPREHENSIVE METABOLIC PANEL
ALT: 12 U/L (ref 0–44)
AST: 22 U/L (ref 15–41)
Albumin: 3.3 g/dL — ABNORMAL LOW (ref 3.5–5.0)
Alkaline Phosphatase: 91 U/L (ref 38–126)
Anion gap: 8 (ref 5–15)
BUN: 7 mg/dL (ref 6–20)
CO2: 25 mmol/L (ref 22–32)
Calcium: 9.1 mg/dL (ref 8.9–10.3)
Chloride: 103 mmol/L (ref 98–111)
Creatinine, Ser: 0.83 mg/dL (ref 0.44–1.00)
GFR, Estimated: >60 mL/min (ref >60)
Glucose, Bld: 120 mg/dL — ABNORMAL HIGH (ref 70–99)
Potassium: 3.6 mmol/L (ref 3.5–5.1)
Sodium: 136 mmol/L (ref 135–145)
Total Bilirubin: 0.4 mg/dL (ref 0.3–1.2)
Total Protein: 8.3 g/dL — ABNORMAL HIGH (ref 6.5–8.1)

## 2022-04-03 LAB — WET PREP, GENITAL
Clue Cells Wet Prep HPF POC: NONE SEEN
Sperm: NONE SEEN
Trich, Wet Prep: NONE SEEN
WBC, Wet Prep HPF POC: <10 (ref <10)
Yeast Wet Prep HPF POC: NONE SEEN

## 2022-04-03 LAB — HCG, SERUM, QUALITATIVE: Preg, Serum: NEGATIVE

## 2022-04-03 NOTE — ED Notes (Signed)
Pt heart rate increased during ER visit when pt was moving around and with exertion. Pt HR decreased to baseline after she would sit down and rest for a few moments. EDP aware.

## 2022-04-03 NOTE — Discharge Instructions (Addendum)
You were seen in the emergency department today for normal vaginal bleeding.  Your overall workup today was nonspecific.  There was no evidence of anemia or systemic infection.  Also without active bleeding seen on exam.  Please follow-up closely with OB/GYN.  You have been provided the contact information for to local OB/GYN offices.  Please call to schedule a follow-up appointment within the next 1 week.  Return to the ED for new or worsening symptoms as discussed.

## 2022-04-03 NOTE — ED Triage Notes (Addendum)
C/o heavy vaginal bleeding x 1 week with clots. Denies abdominal pain. States still gets periods but they're normally light. States has had bleeding 3 times in the past month

## 2022-04-03 NOTE — ED Provider Notes (Signed)
[facility] EMERGENCY DEPARTMENT Provider Note   CSN: [id number] Arrival date & time: 04/03/22  6213     History {Add pertinent medical, surgical, social history, OB history to HPI:1} Chief Complaint  Patient presents with   Vaginal Bleeding    Paula Carter is a 51 y.o. female with Hx of abnormal uterine bleeding presenting to the ED today due to abnormal uterine bleeding.  States on and off over the last 4 months she has had very light vaginal bleeding.  Then over the last month she has had intermittent bleeding, with the most recent streak of 7 days including today.  States this morning she woke up and saw several blood clots in the toilet.  Denies abdominal pain, N/V/D, changes in bowel habits, urinary symptoms, fevers, chills.  Denies recent injury.  Denies possibility of pregnancy.  States she does not currently have an OB/GYN.  Is concerned she may be going through premenopause.  The history is provided by the patient and medical records.  Vaginal Bleeding     Home Medications Prior to Admission medications   Medication Sig Start Date End Date Taking? Authorizing Provider  albuterol (PROVENTIL HFA;VENTOLIN HFA) 108 (90 BASE) MCG/ACT inhaler Inhale into the lungs every 6 (six) hours as needed for wheezing or shortness of breath.    [provider]  albuterol (PROVENTIL) (2.5 MG/3ML) 0.083% nebulizer solution Take 2.5 mg by nebulization 2 (two) times daily.    [provider]  fluticasone (FLOVENT DISKUS) 50 MCG/BLIST diskus inhaler Inhale 1 puff into the lungs 2 (two) times daily.    [provider]  predniSONE (DELTASONE) 20 MG tablet Take 3 tablets (60 mg total) by mouth daily. 07/25/13   Horton, Mayer Masker, MD  predniSONE (DELTASONE) 20 MG tablet Take 3 tablets (60 mg total) by mouth daily. 07/25/13   Horton, Mayer Masker, MD      Allergies    Prednisone and Penicillins    Review of Systems   Review of Systems  Genitourinary:  Positive  for vaginal bleeding.    Physical Exam Updated Vital Signs BP 125/69   Pulse 90   Temp 97.9 F (36.6 C) (Oral)   Resp 17   Ht 5\' 2"  (1.575 m)   Wt (!) 181.4 kg   LMP  (LMP Unknown)   SpO2 100%   BMI 73.16 kg/m  Physical Exam Vitals and nursing note reviewed. Exam conducted with a chaperone present.  Constitutional:      General: She is not in acute distress.    Appearance: She is well-developed. She is obese. She is not ill-appearing, toxic-appearing or diaphoretic.  HENT:     Head: Normocephalic and atraumatic.  Eyes:     Conjunctiva/sclera: Conjunctivae normal.  Neck:     Comments: Very supple on exam, no meningismus Cardiovascular:     Rate and Rhythm: Normal rate and regular rhythm.     Heart sounds: No murmur heard. Pulmonary:     Effort: Pulmonary effort is normal. No respiratory distress.     Breath sounds: Normal breath sounds.  Abdominal:     General: Abdomen is protuberant. Bowel sounds are normal. There is no distension.     Palpations: Abdomen is soft.     Tenderness: There is no abdominal tenderness. There is no right CVA tenderness, left CVA tenderness, guarding or rebound. Negative signs include Murphy's sign and McBurney's sign.  Genitourinary:    Exam position: Lithotomy position.     Pubic Area: No rash.  Labia:        Right: No rash or tenderness.        Left: No rash or tenderness.      Vagina: No foreign body. No vaginal discharge, erythema or tenderness.     Cervix: No cervical motion tenderness, friability or erythema.     Uterus: Not tender.      Adnexa:        Right: No tenderness.         Left: No tenderness.       Comments: Chaperone present for pelvic exam.  1-2 small 1 to 2 mm blood clots appreciated near the cervix opening.  Once removed with sterile consult, no active bleeding appreciated.  No cervical motion tenderness or friability.  No vaginal lesions.  On bimanual exam, no uterine or ovarian tenderness, though difficult to detect  enlargement/mass due to body habitus. Musculoskeletal:        General: No swelling.     Cervical back: Neck supple. No rigidity.  Skin:    General: Skin is warm and dry.     Capillary Refill: Capillary refill takes less than 2 seconds.  Neurological:     Mental Status: She is alert.  Psychiatric:        Mood and Affect: Mood normal.     ED Results / Procedures / Treatments   Labs (all labs ordered are listed, but only abnormal results are displayed) Labs Reviewed  COMPREHENSIVE METABOLIC PANEL - Abnormal; Notable for the following components:      Result Value   Glucose, Bld 120 (*)    Total Protein 8.3 (*)    Albumin 3.3 (*)    All other components within normal limits  WET PREP, GENITAL  CBC WITH DIFFERENTIAL/PLATELET  HCG, SERUM, QUALITATIVE  GC/CHLAMYDIA PROBE AMP ([place]) NOT AT [facility]    EKG None  Radiology No results found.  Procedures Procedures  {Document cardiac monitor, telemetry assessment procedure when appropriate:1}  Medications Ordered in ED Medications - No data to display  ED Course/ Medical Decision Making/ A&P Clinical Course as of 04/03/22 1227  Wed Apr 03, 2022  1141 Pulse Rate(!): 104 Elevated heart rate after activity or movement, then returns to 80-90 bpm [AC]    Clinical Course User Index [AC] Cecil Cobbs, PA-C                           Medical Decision Making Amount and/or Complexity of Data Reviewed Labs: ordered.   51 y.o. female presents to the ED for concern of Vaginal Bleeding   This involves an extensive number of treatment options, and is a complaint that carries with it a high risk of complications and morbidity.  The emergent differential diagnosis prior to evaluation includes, but is not limited to: ***  This is not an exhaustive differential.   Past Medical History / Co-morbidities / Social History: Hx of abnormal uterine bleeding, obesity. Social Determinants of Health include: No OB/GYN, resources  provided.  Additional History:  Obtained by chart review.  Notably ***  Lab Tests: I ordered, and personally interpreted labs.  The pertinent results include:   CBC without elevated WBC, leukocytosis, or evidence of anemia Serum pregnancy negative CMP *** Wet prep *** G/C pending  Imaging Studies: None  ED Course: Pt well-appearing on exam.  ***.  Presenting with vaginal bleeding.  Hx of abnormal uterine bleeding. No abdominal pain, N/V/D, pelvic pain, or changes in urinary  or bowel habits.  No active bleeding on pelvic exam, 1-2 very small clots visualized.  Low clinical suspicion for PID.  Patient also with intermittent periods of tachycardia, which occur shortly after activity and returns back to normal rate.  Believe this is likely due to normal exertion, which patient also reports.  Without chest pain or shortness of breath.  Not on anticoagulation. Recommend close follow-up with OB/GYN for further evaluation and continued medical management.  Strict return precautions discussed at length. Patient in NAD and in good condition at time of discharge.  Disposition: After consideration the patient's encounter today, I do not feel today's workup suggests an emergent condition requiring admission or immediate intervention beyond what has been performed at this time.  Safe for discharge; instructed to return immediately for worsening symptoms, change in symptoms or any other concerns.  I have reviewed the patients home medicines and have made adjustments as needed.  Discussed course of treatment with the patient, whom demonstrated understanding.  Patient in agreement and has no further questions.    This chart was dictated using voice recognition software.  Despite best efforts to proofread, errors can occur which can change the documentation meaning.   {Document critical care time when appropriate:1} {Document review of labs and clinical decision tools ie heart score, Chads2Vasc2 etc:1}   {Document your independent review of radiology images, and any outside records:1} {Document your discussion with family members, caretakers, and with consultants:1} {Document social determinants of health affecting pt's care:1} {Document your decision making why or why not admission, treatments were needed:1} Final Clinical Impression(s) / ED Diagnoses Final diagnoses:  Vaginal bleeding    Rx / DC Orders ED Discharge Orders     None

## 2022-04-04 LAB — GC/CHLAMYDIA PROBE AMP (~~LOC~~) NOT AT ARMC
Chlamydia: NEGATIVE
Comment: NEGATIVE
Comment: NORMAL
Neisseria Gonorrhea: NEGATIVE

## 2022-08-24 ENCOUNTER — Other Ambulatory Visit: Payer: Self-pay

## 2022-08-24 ENCOUNTER — Encounter (HOSPITAL_BASED_OUTPATIENT_CLINIC_OR_DEPARTMENT_OTHER): Payer: Self-pay

## 2022-08-24 ENCOUNTER — Emergency Department (HOSPITAL_BASED_OUTPATIENT_CLINIC_OR_DEPARTMENT_OTHER)
Admission: EM | Admit: 2022-08-24 | Discharge: 2022-08-24 | Disposition: A | Discharge status: Home / Self Care | Payer: Medicaid Other | Attending: Emergency Medicine | Admitting: Emergency Medicine

## 2022-08-24 ENCOUNTER — Emergency Department (HOSPITAL_BASED_OUTPATIENT_CLINIC_OR_DEPARTMENT_OTHER): Payer: Medicaid Other

## 2022-08-24 DIAGNOSIS — J45909 Unspecified asthma, uncomplicated: Secondary | ICD-10-CM | POA: Diagnosis not present

## 2022-08-24 DIAGNOSIS — Z1152 Encounter for screening for COVID-19: Secondary | ICD-10-CM | POA: Diagnosis not present

## 2022-08-24 DIAGNOSIS — I10 Essential (primary) hypertension: Secondary | ICD-10-CM | POA: Insufficient documentation

## 2022-08-24 DIAGNOSIS — Z79899 Other long term (current) drug therapy: Secondary | ICD-10-CM | POA: Diagnosis not present

## 2022-08-24 DIAGNOSIS — J069 Acute upper respiratory infection, unspecified: Secondary | ICD-10-CM | POA: Diagnosis not present

## 2022-08-24 DIAGNOSIS — J029 Acute pharyngitis, unspecified: Secondary | ICD-10-CM | POA: Diagnosis present

## 2022-08-24 LAB — RESP PANEL BY RT-PCR (RSV, FLU A&B, COVID)  RVPGX2
Influenza A by PCR: NEGATIVE
Influenza B by PCR: NEGATIVE
Resp Syncytial Virus by PCR: NEGATIVE
SARS Coronavirus 2 by RT PCR: NEGATIVE

## 2022-08-24 LAB — GROUP A STREP BY PCR: Group A Strep by PCR: NOT DETECTED

## 2022-08-24 MED ORDER — CEPACOL SORE THROAT & COUGH 5-7.5 MG MT LOZG: 1.0000 | LOZENGE | Freq: Four times a day (QID) | OROMUCOSAL | 0 refills | Status: AC | PRN

## 2022-08-24 MED ORDER — AZITHROMYCIN 250 MG PO TABS: 250.0000 mg | ORAL_TABLET | Freq: Every day | ORAL | 0 refills | Status: AC

## 2022-08-24 MED ORDER — GUAIFENESIN ER 600 MG PO TB12: 600.0000 mg | ORAL_TABLET | Freq: Two times a day (BID) | ORAL | 0 refills | Status: AC | PRN

## 2022-08-24 NOTE — ED Triage Notes (Signed)
Sore throat x 3 days with intermittent coughing. Tonight coughed up a Mickiewicz chunk.   Dyspnea with exertion.

## 2022-08-24 NOTE — ED Provider Notes (Signed)
[facility] Provider Note: Lowella Dell, MD, FACEP  CSN: [id number] MRN: [id number] ARRIVAL: 08/24/22 at 0410 ROOM: [facility]12/[facility]   CHIEF COMPLAINT  Sore Throat   HISTORY OF PRESENT ILLNESS  08/24/22 4:20 AM Paula Carter is a 52 y.o. female with a history of asthma.  She has had a sore throat for the past 3 days.  She has had intermittent coughing with.  Earlier she coughed up a chunk of Hoffmeister-streaked green sputum which scared her.  She is also having shortness of breath, worse with exertion, but presently controlled with her albuterol inhaler.  She rates her throat discomfort as a 6 out of 10, worse with swallowing.  She was noted to be hypertensive on arrival but states she has not taken her morning dose of her antihypertensive.  She was previously on Zestoretic but was recently taken off of this due to cough and her losartan dose was increased to 100 mg daily.   Past Medical History:  Diagnosis Date   Arthritis    Asthma    Hypertension    Obesity     Past Surgical History:  Procedure Laterality Date   CHOLECYSTECTOMY      History reviewed. No pertinent family history.  Social History   Tobacco Use   Smoking status: Never  Substance Use Topics   Alcohol use: No   Drug use: No    Prior to Admission medications   Medication Sig Start Date End Date Taking? Authorizing Provider  atorvastatin (LIPITOR) 40 MG tablet Take 1 tablet by mouth daily. 07/08/22  Yes [provider]  losartan (COZAAR) 100 MG tablet Take 100 mg by mouth daily. 08/21/22  Yes [provider]  albuterol (PROVENTIL HFA;VENTOLIN HFA) 108 (90 BASE) MCG/ACT inhaler Inhale into the lungs every 6 (six) hours as needed for wheezing or shortness of breath.    [provider]  albuterol (PROVENTIL) (2.5 MG/3ML) 0.083% nebulizer solution Take 2.5 mg by nebulization 2 (two) times daily.    [provider]  fluticasone (FLOVENT DISKUS) 50 MCG/BLIST diskus inhaler  Inhale 1 puff into the lungs 2 (two) times daily.    [provider]    Allergies Penicillins, Prednisone, and Influenza vaccines   REVIEW OF SYSTEMS  Negative except as noted here or in the History of Present Illness.   PHYSICAL EXAMINATION  Initial Vital Signs Blood pressure (!) 203/80, pulse (!) 118, temperature 97.7 F (36.5 C), temperature source Oral, resp. rate (!) 28, height 5\' 2"  (1.575 m), weight (!) 174.6 kg, SpO2 99 %.  Examination General: Well-developed, high BMI female in no acute distress; appearance consistent with age of record HENT: normocephalic; atraumatic; mild tonsillar erythema Eyes: Normal appearance Neck: supple Heart: regular rate and rhythm Lungs: clear to auscultation bilaterally except for faint wheeze in right base Abdomen: soft; nondistended; nontender; bowel sounds present Extremities: No deformity; full range of motion; pulses normal Neurologic: Awake, alert and oriented; motor function intact in all extremities and symmetric; no facial droop Skin: Warm and dry Psychiatric: Mildly anxious   RESULTS  Summary of this visit's results, reviewed and interpreted by myself:   EKG Interpretation  Date/Time:    Ventricular Rate:    PR Interval:    QRS Duration:   QT Interval:    QTC Calculation:   R Axis:     Text Interpretation:         Laboratory Studies: Results for orders placed or performed during the hospital encounter of 08/24/22 (from the  past 24 hour(s))  Resp panel by RT-PCR (RSV, Flu A&B, Covid) Anterior Nasal Swab     Status: None   Collection Time: 08/24/22  4:30 AM   Specimen: Anterior Nasal Swab  Result Value Ref Range   SARS Coronavirus 2 by RT PCR NEGATIVE NEGATIVE   Influenza A by PCR NEGATIVE NEGATIVE   Influenza B by PCR NEGATIVE NEGATIVE   Resp Syncytial Virus by PCR NEGATIVE NEGATIVE  Group A Strep by PCR     Status: None   Collection Time: 08/24/22  4:30 AM   Specimen: Anterior Nasal Swab; Sterile Swab   Result Value Ref Range   Group A Strep by PCR NOT DETECTED NOT DETECTED   Imaging Studies: DG Chest 2 View  Result Date: 08/24/2022 CLINICAL DATA:  52 year old female with history of cough and shortness of breath for the past 3 days. EXAM: CHEST - 2 VIEW COMPARISON:  Chest x-ray 04/03/2021. FINDINGS: Lung volumes are normal. No consolidative airspace disease. No pleural effusions. No pneumothorax. No pulmonary nodule or mass noted. Pulmonary vasculature and the cardiomediastinal silhouette are within normal limits. IMPRESSION: No radiographic evidence of acute cardiopulmonary disease. Electronically Signed   By: Trudie Reed M.D.   On: 08/24/2022 05:02    ED COURSE and MDM  Nursing notes, initial and subsequent vitals signs, including pulse oximetry, reviewed and interpreted by myself.  Vitals:   08/24/22 0417 08/24/22 0430 08/24/22 0445 08/24/22 0500  BP:  (!) 175/88 (!) 147/63 (!) 155/55  Pulse:  91 96 87  Resp:  20  20  Temp:      TempSrc:      SpO2:  98% 100% 95%  Weight: (!) 174.6 kg     Height: 5\' 2"  (1.575 m)      Medications - No data to display  Patient's symptoms are most likely due to a viral URI.  She could have a secondary bacterial bronchitis developing so I think it is reasonable, given her risk factors, to place her on an antibiotic.  She is already on an inhaled steroid and states she does not tolerate prednisone.  PROCEDURES  Procedures   ED DIAGNOSES     ICD-10-CM   1. Viral URI with cough  J06.9       `   Masako Overall, MD 08/24/22 [phone]

## 2023-04-21 ENCOUNTER — Telehealth: Payer: Self-pay | Admitting: Plastic Surgery

## 2023-04-22 NOTE — Telephone Encounter (Signed)
Error was showing an example

## 2023-05-15 ENCOUNTER — Encounter (HOSPITAL_BASED_OUTPATIENT_CLINIC_OR_DEPARTMENT_OTHER): Payer: Self-pay | Admitting: Emergency Medicine

## 2023-05-15 ENCOUNTER — Other Ambulatory Visit: Payer: Self-pay

## 2023-05-15 DIAGNOSIS — Z5321 Procedure and treatment not carried out due to patient leaving prior to being seen by health care provider: Secondary | ICD-10-CM | POA: Diagnosis not present

## 2023-05-15 DIAGNOSIS — R197 Diarrhea, unspecified: Secondary | ICD-10-CM | POA: Insufficient documentation

## 2023-05-15 LAB — COMPREHENSIVE METABOLIC PANEL
ALT: 15 U/L (ref 0–44)
AST: 20 U/L (ref 15–41)
Albumin: 3.2 g/dL — ABNORMAL LOW (ref 3.5–5.0)
Alkaline Phosphatase: 108 U/L (ref 38–126)
Anion gap: 9 (ref 5–15)
BUN: 10 mg/dL (ref 6–20)
CO2: 22 mmol/L (ref 22–32)
Calcium: 8.6 mg/dL — ABNORMAL LOW (ref 8.9–10.3)
Chloride: 103 mmol/L (ref 98–111)
Creatinine, Ser: 0.95 mg/dL (ref 0.44–1.00)
GFR, Estimated: >60 mL/min (ref >60)
Glucose, Bld: 114 mg/dL — ABNORMAL HIGH (ref 70–99)
Potassium: 3.6 mmol/L (ref 3.5–5.1)
Sodium: 134 mmol/L — ABNORMAL LOW (ref 135–145)
Total Bilirubin: 0.4 mg/dL (ref 0.0–1.2)
Total Protein: 7.9 g/dL (ref 6.5–8.1)

## 2023-05-15 LAB — LIPASE, BLOOD: Lipase: 26 U/L (ref 11–51)

## 2023-05-15 LAB — URINALYSIS, ROUTINE W REFLEX MICROSCOPIC
Bilirubin Urine: NEGATIVE
Glucose, UA: NEGATIVE mg/dL
Hgb urine dipstick: NEGATIVE
Ketones, ur: NEGATIVE mg/dL
Leukocytes,Ua: NEGATIVE
Nitrite: NEGATIVE
Protein, ur: NEGATIVE mg/dL
Specific Gravity, Urine: 1.015 (ref 1.005–1.030)
pH: 5.5 (ref 5.0–8.0)

## 2023-05-15 LAB — CBC
HCT: 38.1 % (ref 36.0–46.0)
Hemoglobin: 12.2 g/dL (ref 12.0–15.0)
MCH: 28.9 pg (ref 26.0–34.0)
MCHC: 32 g/dL (ref 30.0–36.0)
MCV: 90.3 fL (ref 80.0–100.0)
Platelets: 269 10*3/uL (ref 150–400)
RBC: 4.22 MIL/uL (ref 3.87–5.11)
RDW: 13 % (ref 11.5–15.5)
WBC: 7.8 10*3/uL (ref 4.0–10.5)
nRBC: 0 % (ref 0.0–0.2)

## 2023-05-15 NOTE — ED Triage Notes (Signed)
Patient c/o diarrhea since Monday at 0300.  Patient recently taken off Wegovy d/t emesis, and patient states the diarrhea started when she stopped.

## 2023-05-16 ENCOUNTER — Emergency Department (HOSPITAL_BASED_OUTPATIENT_CLINIC_OR_DEPARTMENT_OTHER)
Admission: EM | Admit: 2023-05-16 | Discharge: 2023-05-16 | Discharge status: Against Medical Advice | Payer: Medicaid Other | Attending: Emergency Medicine | Admitting: Emergency Medicine
